# Patient Record
Sex: Female | Born: 1985
Health system: Southern US, Community
[De-identification: ages and names within clinical notes are randomized; demographics above are authoritative.]

## PROBLEM LIST (undated history)

## (undated) DIAGNOSIS — Z789 Other specified health status: Secondary | ICD-10-CM

## (undated) DIAGNOSIS — R42 Dizziness and giddiness: Secondary | ICD-10-CM

---
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
## 2003-02-03 ENCOUNTER — Inpatient Hospital Stay (HOSPITAL_COMMUNITY): Admission: AD | Admit: 2003-02-03 | Discharge: 2003-02-03 | Payer: Self-pay | Admitting: Obstetrics and Gynecology

## 2003-03-15 ENCOUNTER — Inpatient Hospital Stay (HOSPITAL_COMMUNITY): Admission: AD | Admit: 2003-03-15 | Discharge: 2003-03-15 | Payer: Self-pay | Admitting: Obstetrics & Gynecology

## 2006-03-28 ENCOUNTER — Emergency Department (HOSPITAL_COMMUNITY): Admission: EM | Admit: 2006-03-28 | Discharge: 2006-03-28 | Payer: Self-pay | Admitting: Family Medicine

## 2006-12-06 ENCOUNTER — Encounter (INDEPENDENT_AMBULATORY_CARE_PROVIDER_SITE_OTHER): Payer: Self-pay | Admitting: *Deleted

## 2008-04-06 ENCOUNTER — Inpatient Hospital Stay (HOSPITAL_COMMUNITY): Admission: AD | Admit: 2008-04-06 | Discharge: 2008-04-06 | Payer: Self-pay | Admitting: Obstetrics & Gynecology

## 2008-05-12 ENCOUNTER — Ambulatory Visit (HOSPITAL_COMMUNITY): Admission: RE | Admit: 2008-05-12 | Discharge: 2008-05-12 | Payer: Self-pay | Admitting: Obstetrics & Gynecology

## 2008-05-18 ENCOUNTER — Ambulatory Visit (HOSPITAL_COMMUNITY): Admission: RE | Admit: 2008-05-18 | Discharge: 2008-05-18 | Payer: Self-pay | Admitting: Family Medicine

## 2008-06-17 ENCOUNTER — Ambulatory Visit (HOSPITAL_COMMUNITY): Admission: RE | Admit: 2008-06-17 | Discharge: 2008-06-17 | Payer: Self-pay | Admitting: Obstetrics & Gynecology

## 2008-08-21 ENCOUNTER — Inpatient Hospital Stay (HOSPITAL_COMMUNITY): Admission: AD | Admit: 2008-08-21 | Discharge: 2008-08-22 | Payer: Self-pay | Admitting: Family Medicine

## 2008-08-21 ENCOUNTER — Ambulatory Visit: Payer: Self-pay | Admitting: Obstetrics and Gynecology

## 2008-11-17 ENCOUNTER — Ambulatory Visit: Payer: Self-pay | Admitting: Obstetrics and Gynecology

## 2008-11-17 ENCOUNTER — Inpatient Hospital Stay (HOSPITAL_COMMUNITY): Admission: AD | Admit: 2008-11-17 | Discharge: 2008-11-17 | Payer: Self-pay | Admitting: Obstetrics & Gynecology

## 2008-11-19 ENCOUNTER — Inpatient Hospital Stay (HOSPITAL_COMMUNITY): Admission: AD | Admit: 2008-11-19 | Discharge: 2008-11-23 | Payer: Self-pay | Admitting: Obstetrics & Gynecology

## 2008-11-19 ENCOUNTER — Ambulatory Visit: Payer: Self-pay | Admitting: Obstetrics & Gynecology

## 2008-11-20 ENCOUNTER — Encounter: Payer: Self-pay | Admitting: Obstetrics & Gynecology

## 2008-11-20 ENCOUNTER — Ambulatory Visit: Payer: Self-pay | Admitting: Obstetrics & Gynecology

## 2008-12-29 ENCOUNTER — Inpatient Hospital Stay (HOSPITAL_COMMUNITY): Admission: AD | Admit: 2008-12-29 | Discharge: 2008-12-29 | Payer: Self-pay | Admitting: Obstetrics & Gynecology

## 2010-01-07 ENCOUNTER — Emergency Department (HOSPITAL_BASED_OUTPATIENT_CLINIC_OR_DEPARTMENT_OTHER): Admission: EM | Admit: 2010-01-07 | Discharge: 2010-01-07 | Payer: Self-pay | Admitting: Emergency Medicine

## 2010-01-07 ENCOUNTER — Ambulatory Visit: Payer: Self-pay | Admitting: Radiology

## 2010-12-27 LAB — DIFFERENTIAL
Basophils Absolute: 0.1 10*3/uL (ref 0.0–0.1)
Eosinophils Absolute: 0 10*3/uL (ref 0.0–0.7)
Lymphs Abs: 0.8 10*3/uL (ref 0.7–4.0)
Neutrophils Relative %: 80 % — ABNORMAL HIGH (ref 43–77)

## 2010-12-27 LAB — CBC
HCT: 37.8 % (ref 36.0–46.0)
Hemoglobin: 12.2 g/dL (ref 12.0–15.0)
MCHC: 32.3 g/dL (ref 30.0–36.0)
MCV: 84.6 fL (ref 78.0–100.0)
Platelets: 234 10*3/uL (ref 150–400)
RBC: 4.47 MIL/uL (ref 3.87–5.11)
RDW: 13.4 % (ref 11.5–15.5)
WBC: 10.3 10*3/uL (ref 4.0–10.5)

## 2010-12-27 LAB — URINE CULTURE

## 2010-12-27 LAB — URINE MICROSCOPIC-ADD ON

## 2010-12-27 LAB — PREGNANCY, URINE: Preg Test, Ur: NEGATIVE

## 2010-12-27 LAB — URINALYSIS, ROUTINE W REFLEX MICROSCOPIC: pH: 6 (ref 5.0–8.0)

## 2011-01-03 ENCOUNTER — Inpatient Hospital Stay (HOSPITAL_COMMUNITY)
Admission: AD | Admit: 2011-01-03 | Discharge: 2011-01-03 | Disposition: A | Discharge status: Home / Self Care | Payer: Medicaid Other | Source: Ambulatory Visit | Attending: Obstetrics and Gynecology | Admitting: Obstetrics and Gynecology

## 2011-01-03 DIAGNOSIS — A499 Bacterial infection, unspecified: Secondary | ICD-10-CM

## 2011-01-03 DIAGNOSIS — N949 Unspecified condition associated with female genital organs and menstrual cycle: Secondary | ICD-10-CM | POA: Insufficient documentation

## 2011-01-03 DIAGNOSIS — N760 Acute vaginitis: Secondary | ICD-10-CM | POA: Insufficient documentation

## 2011-01-03 DIAGNOSIS — B373 Candidiasis of vulva and vagina: Secondary | ICD-10-CM

## 2011-01-03 DIAGNOSIS — B3731 Acute candidiasis of vulva and vagina: Secondary | ICD-10-CM | POA: Insufficient documentation

## 2011-01-03 DIAGNOSIS — B9689 Other specified bacterial agents as the cause of diseases classified elsewhere: Secondary | ICD-10-CM | POA: Insufficient documentation

## 2011-01-03 LAB — WET PREP, GENITAL: Yeast Wet Prep HPF POC: NONE SEEN

## 2011-01-18 LAB — DIFFERENTIAL
Basophils Relative: 2 % — ABNORMAL HIGH (ref 0–1)
Eosinophils Absolute: 0.1 10*3/uL (ref 0.0–0.7)
Monocytes Relative: 7 % (ref 3–12)

## 2011-01-18 LAB — CBC
MCHC: 32.6 g/dL (ref 30.0–36.0)
Platelets: 248 10*3/uL (ref 150–400)
WBC: 4 10*3/uL (ref 4.0–10.5)

## 2011-01-18 LAB — GC/CHLAMYDIA PROBE AMP, GENITAL
Chlamydia, DNA Probe: NEGATIVE
GC Probe Amp, Genital: NEGATIVE

## 2011-01-18 LAB — WET PREP, GENITAL
Clue Cells Wet Prep HPF POC: NONE SEEN
Yeast Wet Prep HPF POC: NONE SEEN

## 2011-01-23 LAB — COMPREHENSIVE METABOLIC PANEL
ALT: 17 U/L (ref 0–35)
Calcium: 9.4 mg/dL (ref 8.4–10.5)
GFR calc Af Amer: >60 mL/min (ref >60)
Glucose, Bld: 100 mg/dL — ABNORMAL HIGH (ref 70–99)
Sodium: 134 mEq/L — ABNORMAL LOW (ref 135–145)
Total Protein: 6.5 g/dL (ref 6.0–8.3)

## 2011-01-23 LAB — CBC
HCT: 26.6 % — ABNORMAL LOW (ref 36.0–46.0)
MCV: 90.3 fL (ref 78.0–100.0)
Platelets: 173 10*3/uL (ref 150–400)
Platelets: 195 10*3/uL (ref 150–400)
RDW: 15.1 % (ref 11.5–15.5)
WBC: 14.4 10*3/uL — ABNORMAL HIGH (ref 4.0–10.5)

## 2011-01-23 LAB — RPR: RPR Ser Ql: NONREACTIVE

## 2011-02-20 NOTE — Discharge Summary (Signed)
NAMETARALYN, FERRAIOLO                 ACCOUNT NO.:  [id number]   MEDICAL RECORD NO.:  [id number]          PATIENT TYPE:  INP   LOCATION:  9131                          FACILITY:  [facility]   PHYSICIAN:  Scheryl Darter, MD       DATE OF BIRTH:  18-Nov-1985   DATE OF ADMISSION:  11/19/2008  DATE OF DISCHARGE:  11/23/2008                               DISCHARGE SUMMARY   DISCHARGE DIAGNOSES:  1. Primary low-transverse cesarean section.  2. History of chorioamnionitis.  3. Anemia.   DISCHARGE MEDICATIONS:  1. Percocet 5/325 mg 1 tablet p.o. q.6 h. p.r.n. pain.  2. Micronor OCP 1 tablet p.o. daily at the same time everyday.  3. Prenatal vitamin 1 tablet p.o. daily.  4. Iron sulfate 325 mg 1 tablet p.o. b.i.d. for postpartum anemia.   LABORATORY DATA:  1. CBC on November 19, 2008:  WBC 7.6, hemoglobin 12.0, hematocrit      36.5, platelets 195.  2. CMP:  Sodium 134, AST 34, ALT 17, creatinine 0.77.  3. RPR nonreactive.  4. CBC on November 21, 2008:  WBC 14.4, hemoglobin 9.0, hematocrit      26.6, platelet count 173.   BRIEF HOSPITAL COURSE:  The patient is a 25 year old G1, P1 admitted for  spontaneous onset of labor who had chorioamnionitis as well as a primary  low-transverse cesarean section.  1. Primary low-transverse cesarean section.  The patient was admitted      on November 19, 2008, in labor.  The patient was having adequate      contractions and making cervical change.  Fetal tachycardia was      noted with variables.  The patient remained in second stage arrest      for 5 hours.  Therefore, the patient was consented for a C-section.      The procedure was a primary low-transverse cesarean section.  The      attending was Dr. Debroah Loop.  Estimated fetal weight was 6 pounds 15      ounces.  Apgars were 8 and 9.  Dr. Debroah Loop delivered a viable live-      born female at 0600 on November 20, 2008, under epidural      anesthesia.  Estimated blood loss was 700 mL.  The patient      tolerated  the procedure well with no immediate complications.      After the surgery, the patient recovered well.  On the day of      discharge, the patient's abdominal pain had significantly improved,      but was still present, especially with pumping, but this was      controlled with ibuprofen and Percocet.  The patient was tolerating      p.o.'s, ambulating, positive void, and had decreased bleeding.  The      patient's incision was clean, dry, and intact and her fundus was      firm.  The patient was in complete understanding and agreement with      discharge.  2. Chorioamnionitis.  During the second stage of labor, there was  noted to have fetal tachycardia as well as some occasional      variables.  The patient did not end up spiking a recorded fever,      however, antibiotics were started for chorioamnionitis.  The      patient was taken to C-section for definitive treatment.  3. Postpartum anemia:  The patient had a hemoglobin drop from 12-9.      This was after surgery on postop day #2.  The patient's bleeding      had significantly decreased after the surgery and was only faint at      the time of discharge.  The patient denied any lightheadedness,      dizziness, or other complaints.  The patient will be sent home with      iron sulfate.   DISCHARGE INSTRUCTIONS:  The patient is to have pelvic rest x6 weeks.  The patient should increase her activity slowly.  The patient has no  restrictions on her diet.   FOLLOWUP:  The patient should follow up with [facility]  in 6 weeks for routine postpartum care.   DISCHARGE CONDITION:  Good.      Angelena Sole, MD      Scheryl Darter, MD  Electronically Signed    WS/MEDQ  D:  11/23/2008  T:  11/23/2008  Job:  [id number]

## 2011-02-20 NOTE — Op Note (Signed)
NAMESTEFANNIE, [name]                 ACCOUNT NO.:  [id number]   MEDICAL RECORD NO.:  [id number]          PATIENT TYPE:  INP   LOCATION:  9131                          FACILITY:  [facility]   PHYSICIAN:  Scheryl Darter, MD       DATE OF BIRTH:  1986/02/04   DATE OF PROCEDURE:  DATE OF DISCHARGE:                               OPERATIVE REPORT   PROCEDURE:  Primary low transverse cesarean section.   PREOPERATIVE DIAGNOSES:  1. Intrauterine pregnancy at term,  second stage arrest of labor.  2. Intrauterine infection.   POSTOPERATIVE DIAGNOSES:  Intrauterine pregnancy, delivered a live born  female at 16 with Apgars of 8 and 9, weighed 6 pounds and 15 ounces.   SURGEON:  Scheryl Darter, MD   ASSISTANT:  Malachi Pro. Ambrose Mantle, MD   ANESTHESIA:  Epidural.   ESTIMATED BLOOD LOSS:  700 mL.   SPECIMENS:  Placenta was sent to pathology.   DRAIN:  Foley catheter.   FLUIDS:  IV fluids 1500 mL.   URINE OUTPUT:  150 mL.   COUNTS:  Correct.   OPERATIVE COURSE:  The patient gave written consent for primary cesarean  section after diagnosis of second stage arrest of labor was made at 40  weeks, 3 days gestation.  The patient had rupture of membranes greater  than 20 hours and borderline elevated blood pressure and fetal  tachycardia.  Ampicillin and gentamicin had been given.  The patient  identification was confirmed.  She was brought to the OR and adequate  epidural anesthesia was induced.  She was placed in dorsal supine  position.  A Foley catheter was placed.  Abdomen was sterilely prepped  and draped.  A #10 blade was used to make a Pfannenstiel incision down  to the fascia.  The fascia was incised and the incision was extended  transversely with curved Mayo scissors.  The fascia was separated from  underlying tissue attachments with blunt and sharp dissection.  The  rectus muscles were separated at the midline.  The peritoneum was  elevated and incised with Metzenbaum scissors and incision was  extended  vertically.  Bladder blade was inserted.  Vesicouterine fold was incised  with Metzenbaum scissors and the incision was extended transversely and  bladder flap was created.  Bladder blade was reinserted.  The lower  uterine segment was entered at the midline with #10 blade and meconium  stained fluid was expressed.  Incision was extended transversely with  blunt traction.  The fetal head was elevated from the pelvis and  delivered.  Mouth and nose were cleared with bulb suction and with DeLee  suction.  The infant was delivered and the infant was vigorous at birth.  Cord was clamped and cut, and the infant was handed to nursery personnel  in attendance.  The infant was a female, 6 pounds 15 ounces, Apgars 8  and 9, delivered at 0601.  Placenta was removed and sent, and the  uterine cavity was explored.  A 0 Vicryl was used to close the uterine  incision with a running-locking suture.  An imbricating  layer followed  with 0 Vicryl and hemostasis was obtained at the right side of the  incision with an interrupted suture.  Good hemostasis was assured and  the pelvis was irrigated.  Both tubes and ovaries were visualized and  were normal.  The anterior peritoneum was closed with a running suture  of 2-0 Vicryl.  Fascia was closed with running suture of 0-Vicryl.  Hemostasis was assured and the incision was irrigated.  The skin was  closed with a running subcuticular suture with 4-0 Vicryl.  Sterile  dressing was applied.  The patient tolerated the procedure well without  complications.  The placenta was sent to pathology.  She was brought in  stable condition to PACU.      Scheryl Darter, MD     JA/MEDQ  D:  11/20/2008  T:  11/20/2008  Job:  [phone] [id number]

## 2011-07-05 LAB — URINALYSIS, ROUTINE W REFLEX MICROSCOPIC
Bilirubin Urine: NEGATIVE
Glucose, UA: NEGATIVE
Ketones, ur: NEGATIVE
Nitrite: NEGATIVE
pH: 5.5

## 2011-07-10 LAB — URINALYSIS, ROUTINE W REFLEX MICROSCOPIC
Glucose, UA: NEGATIVE
Specific Gravity, Urine: <1.005 — ABNORMAL LOW
pH: 6

## 2011-07-10 LAB — WET PREP, GENITAL
Clue Cells Wet Prep HPF POC: NONE SEEN
Trich, Wet Prep: NONE SEEN
Yeast Wet Prep HPF POC: NONE SEEN

## 2011-09-10 ENCOUNTER — Inpatient Hospital Stay (HOSPITAL_COMMUNITY)
Admission: AD | Admit: 2011-09-10 | Discharge: 2011-09-10 | Disposition: A | Discharge status: Home / Self Care | Payer: Self-pay | Source: Ambulatory Visit | Attending: Obstetrics & Gynecology | Admitting: Obstetrics & Gynecology

## 2011-09-10 ENCOUNTER — Encounter (HOSPITAL_COMMUNITY): Payer: Self-pay | Admitting: *Deleted

## 2011-09-10 DIAGNOSIS — K529 Noninfective gastroenteritis and colitis, unspecified: Secondary | ICD-10-CM

## 2011-09-10 DIAGNOSIS — K5289 Other specified noninfective gastroenteritis and colitis: Secondary | ICD-10-CM | POA: Insufficient documentation

## 2011-09-10 DIAGNOSIS — R109 Unspecified abdominal pain: Secondary | ICD-10-CM | POA: Insufficient documentation

## 2011-09-10 HISTORY — DX: Other specified health status: Z78.9

## 2011-09-10 LAB — URINALYSIS, ROUTINE W REFLEX MICROSCOPIC
Bilirubin Urine: NEGATIVE
Ketones, ur: 15 mg/dL — AB
Nitrite: NEGATIVE
Urobilinogen, UA: 0.2 mg/dL (ref 0.0–1.0)

## 2011-09-10 LAB — DIFFERENTIAL
Basophils Relative: 0 % (ref 0–1)
Eosinophils Absolute: 0 10*3/uL (ref 0.0–0.7)
Eosinophils Relative: 0 % (ref 0–5)
Monocytes Relative: 9 % (ref 3–12)
Neutrophils Relative %: 79 % — ABNORMAL HIGH (ref 43–77)

## 2011-09-10 LAB — COMPREHENSIVE METABOLIC PANEL
Albumin: 3.7 g/dL (ref 3.5–5.2)
Alkaline Phosphatase: 116 U/L (ref 39–117)
BUN: 12 mg/dL (ref 6–23)
Calcium: 9.8 mg/dL (ref 8.4–10.5)
Potassium: 3.4 mEq/L — ABNORMAL LOW (ref 3.5–5.1)
Sodium: 135 mEq/L (ref 135–145)
Total Protein: 8.2 g/dL (ref 6.0–8.3)

## 2011-09-10 LAB — CBC
MCH: 27.8 pg (ref 26.0–34.0)
MCHC: 33.2 g/dL (ref 30.0–36.0)
Platelets: 288 10*3/uL (ref 150–400)

## 2011-09-10 LAB — URINE MICROSCOPIC-ADD ON

## 2011-09-10 LAB — POCT PREGNANCY, URINE: Preg Test, Ur: NEGATIVE

## 2011-09-10 MED ORDER — IBUPROFEN 400 MG PO TABS
400.0000 mg | ORAL_TABLET | Freq: Once | ORAL | Status: AC
  Administered 2011-09-10: 400 mg via ORAL
  Filled 2011-09-10: qty 1

## 2011-09-10 MED ORDER — ONDANSETRON 8 MG PO TBDP
8.0000 mg | ORAL_TABLET | Freq: Once | ORAL | Status: AC
  Administered 2011-09-10: 8 mg via ORAL
  Filled 2011-09-10: qty 1

## 2011-09-10 MED ORDER — PROMETHAZINE HCL 25 MG PO TABS: 12.5000 mg | ORAL_TABLET | Freq: Four times a day (QID) | ORAL | Status: AC | PRN

## 2011-09-10 NOTE — ED Provider Notes (Signed)
Attestation of Attending Supervision of Advanced Practitioner: Evaluation and management procedures were performed by the PA/NP/CNM/OB Fellow under my supervision/collaboration. Chart reviewed, and agree with management and plan.  Dionte Blaustein, M.D. 09/10/2011 2:55 PM   

## 2011-09-10 NOTE — Progress Notes (Signed)
Pt in c/o abdominal cramping yesterday, has since subsided but now has epigastric pain, tender to touch.  LMP 09/07/11.  Reports lightheadedness and dizziness today.

## 2011-09-10 NOTE — ED Provider Notes (Signed)
History     CSN: [id number] Arrival date & time: 09/10/2011 11:34 AM   None     Chief Complaint  Patient presents with  . Abdominal Pain    HPI [name] is a 25 y.o. female who presents to MAU for nausea, diarrhea and abdominal pain that started yesterday. No birth control. LMP 12//01/12. Last pap smear less than one year ago and was normal. Current sex partner x 2 years. No history of STDs.   Past Medical History  Diagnosis Date  . No pertinent past medical history     Past Surgical History  Procedure Date  . Cesarean section     History reviewed. No pertinent family history.  History  Substance Use Topics  . Smoking status: Never Smoker   . Smokeless tobacco: Not on file  . Alcohol Use: No    OB History    Grav Para Term Preterm Abortions TAB SAB Ect Mult Living   1 1 1  0 0 0 0 0 0 1      Review of Systems  Constitutional: Positive for appetite change and fatigue. Negative for fever, chills and diaphoresis.  HENT: Negative for ear pain, congestion, sore throat, facial swelling, neck pain, neck stiffness, dental problem and sinus pressure.   Eyes: Negative for photophobia, pain and discharge.  Respiratory: Negative for cough, chest tightness and wheezing.   Cardiovascular: Negative.   Gastrointestinal: Positive for nausea, abdominal pain and diarrhea. Negative for vomiting, constipation and abdominal distention.  Genitourinary: Positive for vaginal bleeding. Negative for dysuria, urgency, frequency, flank pain, vaginal discharge and difficulty urinating.  Musculoskeletal: Positive for back pain. Negative for myalgias and gait problem.  Skin: Negative for color change and rash.  Neurological: Positive for dizziness, light-headedness and headaches. Negative for speech difficulty, weakness and numbness.  Psychiatric/Behavioral: Negative for confusion and agitation.    Allergies  Review of patient's allergies indicates no known allergies.  Home Medications    No current outpatient prescriptions on file.  BP 113/77  Pulse 75  Temp(Src) 99.1 F (37.3 C) (Oral)  Resp 16  Ht 5\' 4"  (1.626 m)  Wt 159 lb 4 oz (72.235 kg)  BMI 27.34 kg/m2  LMP 09/07/2011  Physical Exam  Nursing note and vitals reviewed. Constitutional: She is oriented to person, place, and time. She appears well-developed and well-nourished.  HENT:  Head: Normocephalic.       No nystagmus.   Eyes: EOM are normal.  Neck: Neck supple.  Cardiovascular: Normal rate and regular rhythm.   Pulmonary/Chest: Effort normal and breath sounds normal.  Abdominal: Soft. There is tenderness in the epigastric area.  Musculoskeletal: Normal range of motion. She exhibits no edema.  Neurological: She is alert and oriented to person, place, and time. She has normal strength. No cranial nerve deficit or sensory deficit. She displays a negative Romberg sign.  Reflex Scores:      Bicep reflexes are 2+ on the right side and 2+ on the left side.      Brachioradialis reflexes are 2+ on the right side and 2+ on the left side.      Patellar reflexes are 1+ on the right side and 1+ on the left side.      Normal gait. Stands on one foot without difficulty. Rapid alternating movements without difficulty.  Skin: Skin is warm and dry.  Psychiatric: She has a normal mood and affect. Her behavior is normal. Judgment and thought content normal.   Results for orders placed during  the hospital encounter of 09/10/11 (from the past 24 hour(s))  URINALYSIS, ROUTINE W REFLEX MICROSCOPIC     Status: Abnormal   Collection Time   09/10/11 12:02 PM      Component Value Range   Color, Urine YELLOW  YELLOW    APPearance CLEAR  CLEAR    Specific Gravity, Urine 1.020  1.005 - 1.030    pH 5.5  5.0 - 8.0    Glucose, UA NEGATIVE  NEGATIVE (mg/dL)   Hgb urine dipstick TRACE (*) NEGATIVE    Bilirubin Urine NEGATIVE  NEGATIVE    Ketones, ur 15 (*) NEGATIVE (mg/dL)   Protein, ur NEGATIVE  NEGATIVE (mg/dL)   Urobilinogen,  UA 0.2  0.0 - 1.0 (mg/dL)   Nitrite NEGATIVE  NEGATIVE    Leukocytes, UA NEGATIVE  NEGATIVE   URINE MICROSCOPIC-ADD ON     Status: Normal   Collection Time   09/10/11 12:02 PM      Component Value Range   Squamous Epithelial / LPF RARE  RARE    WBC, UA 0-2  <3 (WBC/hpf)   Bacteria, UA RARE  RARE   CBC     Status: Normal   Collection Time   09/10/11 12:33 PM      Component Value Range   WBC 6.7  4.0 - 10.5 (K/uL)   RBC 4.46  3.87 - 5.11 (MIL/uL)   Hemoglobin 12.4  12.0 - 15.0 (g/dL)   HCT 78.2  95.6 - 21.3 (%)   MCV 83.9  78.0 - 100.0 (fL)   MCH 27.8  26.0 - 34.0 (pg)   MCHC 33.2  30.0 - 36.0 (g/dL)   RDW 08.6  57.8 - 46.9 (%)   Platelets 288  150 - 400 (K/uL)  DIFFERENTIAL     Status: Abnormal   Collection Time   09/10/11 12:33 PM      Component Value Range   Neutrophils Relative 79 (*) 43 - 77 (%)   Neutro Abs 5.3  1.7 - 7.7 (K/uL)   Lymphocytes Relative 12  12 - 46 (%)   Lymphs Abs 0.8  0.7 - 4.0 (K/uL)   Monocytes Relative 9  3 - 12 (%)   Monocytes Absolute 0.6  0.1 - 1.0 (K/uL)   Eosinophils Relative 0  0 - 5 (%)   Eosinophils Absolute 0.0  0.0 - 0.7 (K/uL)   Basophils Relative 0  0 - 1 (%)   Basophils Absolute 0.0  0.0 - 0.1 (K/uL)  COMPREHENSIVE METABOLIC PANEL     Status: Abnormal   Collection Time   09/10/11 12:33 PM      Component Value Range   Sodium 135  135 - 145 (mEq/L)   Potassium 3.4 (*) 3.5 - 5.1 (mEq/L)   Chloride 100  96 - 112 (mEq/L)   CO2 23  19 - 32 (mEq/L)   Glucose, Bld 101 (*) 70 - 99 (mg/dL)   BUN 12  6 - 23 (mg/dL)   Creatinine, Ser 6.29  0.50 - 1.10 (mg/dL)   Calcium 9.8  8.4 - 52.8 (mg/dL)   Total Protein 8.2  6.0 - 8.3 (g/dL)   Albumin 3.7  3.5 - 5.2 (g/dL)   AST 59 (*) 0 - 37 (U/L)   ALT 39 (*) 0 - 35 (U/L)   Alkaline Phosphatase 116  39 - 117 (U/L)   Total Bilirubin 0.4  0.3 - 1.2 (mg/dL)   GFR calc non Af Amer >90  >90 (mL/min)   GFR calc Af Amer >  90  >90 (mL/min)  POCT PREGNANCY, URINE     Status: Normal   Collection Time    09/10/11 12:47 PM      Component Value Range   Preg Test, Ur NEGATIVE     Assessment:  Gastroenteritis  Plan:  B.R.A.T. Diet   Phenergan 12.5 mg    Return as needed.  ED Course  Procedures   MDM          Kerrie Buffalo, NP 09/10/11 1431

## 2011-09-10 NOTE — Progress Notes (Signed)
Pt states epigastric pain, feels like cramping, has been nauseated. LMP-09/07/2011, denies abnormal vaginal d/c changes. Stood up at work today, felt dizzy and had to sit down as not to fall, same scenario yesterday. Has been nauseated, had diarrhea. Denies cough/congestion/sore throat.

## 2012-11-26 ENCOUNTER — Encounter (HOSPITAL_COMMUNITY): Payer: Self-pay | Admitting: *Deleted

## 2012-11-26 ENCOUNTER — Emergency Department (HOSPITAL_COMMUNITY)
Admission: EM | Admit: 2012-11-26 | Discharge: 2012-11-26 | Disposition: A | Discharge status: Home / Self Care | Payer: Self-pay | Attending: Emergency Medicine | Admitting: Emergency Medicine

## 2012-11-26 DIAGNOSIS — J329 Chronic sinusitis, unspecified: Secondary | ICD-10-CM | POA: Insufficient documentation

## 2012-11-26 MED ORDER — FLUTICASONE PROPIONATE 50 MCG/ACT NA SUSP: 2.0000 | Freq: Every day | NASAL | Status: DC

## 2012-11-26 MED ORDER — HYDROCODONE-ACETAMINOPHEN 5-325 MG PO TABS: 1.0000 | ORAL_TABLET | ORAL | Status: DC | PRN

## 2012-11-26 MED ORDER — AMOXICILLIN 500 MG PO CAPS: 500.0000 mg | ORAL_CAPSULE | Freq: Three times a day (TID) | ORAL | Status: DC

## 2012-11-26 NOTE — ED Provider Notes (Signed)
History     CSN: [id number]  Arrival date & time 11/26/12  1638   First MD Initiated Contact with Patient 11/26/12 1722      Chief Complaint  Patient presents with  . Dizziness  . Nasal Congestion    (Consider location/radiation/quality/duration/timing/severity/associated sxs/prior treatment) HPI Comments: [name] is a 27 y.o. Female who is here for evaluation of nasal congestion, nasal discharge, and facial pain, for 1 week. The pain is pressure-like. There is no radiation. She has had symptoms similar to this previously. She denies fever, chills, nausea, vomiting, ear pain, shortness of breath, chest pain, weakness, or persistent dizziness. There are no known modifying factors.  The history is provided by the patient.    Past Medical History  Diagnosis Date  . No pertinent past medical history     Past Surgical History  Procedure Laterality Date  . Cesarean section      History reviewed. No pertinent family history.  History  Substance Use Topics  . Smoking status: Never Smoker   . Smokeless tobacco: Not on file  . Alcohol Use: No    OB History   Grav Para Term Preterm Abortions TAB SAB Ect Mult Living   1 1 1  0 0 0 0 0 0 1      Review of Systems  All other systems reviewed and are negative.    Allergies  Review of patient's allergies indicates no known allergies.  Home Medications   Current Outpatient Rx  Name  Route  Sig  Dispense  Refill  . Diphenhydramine-PE-APAP (THERAFLU WARMING RELIEF NIGHT) 12.5-5-325 MG/15ML LIQD   Oral   Take 15 mLs by mouth at bedtime as needed (for cold/flu symptoms).         Marland Kitchen ibuprofen (ADVIL,MOTRIN) 200 MG tablet   Oral   Take 400 mg by mouth every 6 (six) hours as needed for pain.         Marland Kitchen Phenylephrine-APAP-Guaifenesin (TYLENOL SINUS CONGESTION/PAIN) 5-325-200 MG TABS   Oral   Take 2 tablets by mouth every 4 (four) hours as needed (for sinus/congestion pain).         Marland Kitchen amoxicillin (AMOXIL) 500 MG  capsule   Oral   Take 1 capsule (500 mg total) by mouth 3 (three) times daily.   21 capsule   0   . fluticasone (FLONASE) 50 MCG/ACT nasal spray   Nasal   Place 2 sprays into the nose daily.   16 g   0   . HYDROcodone-acetaminophen (NORCO) 5-325 MG per tablet   Oral   Take 1 tablet by mouth every 4 (four) hours as needed for pain.   20 tablet   0     BP 121/81  Pulse 73  Temp(Src) 98.5 F (36.9 C) (Oral)  Resp 18  SpO2 95%  LMP 11/12/2012  Physical Exam  Nursing note and vitals reviewed. Constitutional: She is oriented to person, place, and time. She appears well-developed and well-nourished.  HENT:  Head: Normocephalic and atraumatic.  Right Ear: External ear normal.  Left Ear: External ear normal.  Mild face pain, to percussion over the frontal and maxillary sinuses, bilaterally. No overt nasal discharge  Eyes: Conjunctivae and EOM are normal. Pupils are equal, round, and reactive to light. Right eye exhibits no discharge. Left eye exhibits no discharge.  Neck: Normal range of motion and phonation normal. Neck supple.  Cardiovascular: Normal rate, regular rhythm and intact distal pulses.   Pulmonary/Chest: Effort normal and breath sounds normal.  She exhibits no tenderness.  Abdominal: Soft. She exhibits no distension. There is no tenderness. There is no guarding.  Musculoskeletal: Normal range of motion.  Neurological: She is alert and oriented to person, place, and time. She has normal strength. She exhibits normal muscle tone.  Skin: Skin is warm and dry.  Psychiatric: Her behavior is normal. Judgment and thought content normal.  Anxious    ED Course  Procedures (including critical care time)   Nursing notes, applicable records and vitals reviewed.  Radiologic Images/Reports reviewed.    1. Sinusitis       MDM  Evaluation consistent with sinusitis. Doubt metabolic instability, serious bacterial infection or impending vascular collapse; the patient  is stable for discharge.       Plan: Home Medications- Norco, amoxicillin, Flonase, Afrin; Home Treatments- rest, fluids, work release, ; Recommended follow up- PCP of choice when necessary   Flint Melter, MD 11/26/12 1746

## 2012-11-26 NOTE — ED Notes (Signed)
Reports having nasal congestion and dizziness that started on thurs but progressively became more severe. Pt is ambulatory at triage, no neuro deficits noted.

## 2013-10-28 ENCOUNTER — Encounter (HOSPITAL_COMMUNITY): Payer: Self-pay | Admitting: *Deleted

## 2013-10-28 ENCOUNTER — Inpatient Hospital Stay (HOSPITAL_COMMUNITY)
Admission: AD | Admit: 2013-10-28 | Discharge: 2013-10-28 | Disposition: A | Discharge status: Home / Self Care | Payer: Medicaid Other | Source: Ambulatory Visit | Attending: Obstetrics and Gynecology | Admitting: Obstetrics and Gynecology

## 2013-10-28 DIAGNOSIS — N390 Urinary tract infection, site not specified: Secondary | ICD-10-CM | POA: Insufficient documentation

## 2013-10-28 DIAGNOSIS — R350 Frequency of micturition: Secondary | ICD-10-CM | POA: Insufficient documentation

## 2013-10-28 DIAGNOSIS — N949 Unspecified condition associated with female genital organs and menstrual cycle: Secondary | ICD-10-CM | POA: Insufficient documentation

## 2013-10-28 LAB — URINALYSIS, ROUTINE W REFLEX MICROSCOPIC
Bilirubin Urine: NEGATIVE
GLUCOSE, UA: NEGATIVE mg/dL
Ketones, ur: NEGATIVE mg/dL
Leukocytes, UA: NEGATIVE
Nitrite: POSITIVE — AB
Protein, ur: NEGATIVE mg/dL
SPECIFIC GRAVITY, URINE: 1.025 (ref 1.005–1.030)
Urobilinogen, UA: 0.2 mg/dL (ref 0.0–1.0)
pH: 5.5 (ref 5.0–8.0)

## 2013-10-28 LAB — URINE MICROSCOPIC-ADD ON

## 2013-10-28 LAB — POCT PREGNANCY, URINE: PREG TEST UR: NEGATIVE

## 2013-10-28 MED ORDER — CIPROFLOXACIN HCL 250 MG PO TABS: 250.0000 mg | ORAL_TABLET | Freq: Two times a day (BID) | ORAL | Status: DC

## 2013-10-28 MED ORDER — PHENAZOPYRIDINE HCL 200 MG PO TABS: 200.0000 mg | ORAL_TABLET | Freq: Three times a day (TID) | ORAL | Status: DC

## 2013-10-28 NOTE — MAU Provider Note (Signed)
History     CSN: [id number]  Arrival date and time: 10/28/13 1528   First Provider Initiated Contact with Patient 10/28/13 1606      Chief Complaint  Patient presents with  . Dysuria   HPI [name] is a 28 y.o. G1P1001 who presents to MAU today with complaint of painful urination since Friday. She states that she started taking AZO tabs with some relief. She is also having frequency and urgency of urination. The patient endorses mild pelvic pain with full bladder. She denies flank pain or fever.   OB History   Grav Para Term Preterm Abortions TAB SAB Ect Mult Living   1 1 1  0 0 0 0 0 0 1      Past Medical History  Diagnosis Date  . No pertinent past medical history     Past Surgical History  Procedure Laterality Date  . Cesarean section      History reviewed. No pertinent family history.  History  Substance Use Topics  . Smoking status: Never Smoker   . Smokeless tobacco: Not on file  . Alcohol Use: No    Allergies: No Known Allergies  No prescriptions prior to admission    Review of Systems  Constitutional: Negative for fever and malaise/fatigue.  Gastrointestinal: Positive for abdominal pain.  Genitourinary: Positive for dysuria, urgency and frequency. Negative for hematuria and flank pain.       Neg - vaginal bleeding, discharge   Physical Exam   Blood pressure 116/76, pulse 72, temperature 98.3 F (36.8 C), temperature source Oral, resp. rate 18, height 5\' 3"  (1.6 m), weight 175 lb 9.6 oz (79.652 kg), last menstrual period 10/20/2013.  Physical Exam  Constitutional: She is oriented to person, place, and time. She appears well-developed and well-nourished. No distress.  HENT:  Head: Normocephalic and atraumatic.  Cardiovascular: Normal rate.   Respiratory: Effort normal.  GI: Soft. She exhibits no distension and no mass. There is no tenderness. There is no rebound, no guarding and no CVA tenderness.  Neurological: She is alert and oriented  to person, place, and time.  Skin: Skin is warm and dry. No erythema.  Psychiatric: She has a normal mood and affect.   Results for orders placed during the hospital encounter of 10/28/13 (from the past 24 hour(s))  URINALYSIS, ROUTINE W REFLEX MICROSCOPIC     Status: Abnormal   Collection Time    10/28/13  3:55 PM      Result Value Range   Color, Urine YELLOW  YELLOW   APPearance CLEAR  CLEAR   Specific Gravity, Urine 1.025  1.005 - 1.030   pH 5.5  5.0 - 8.0   Glucose, UA NEGATIVE  NEGATIVE mg/dL   Hgb urine dipstick SMALL (*) NEGATIVE   Bilirubin Urine NEGATIVE  NEGATIVE   Ketones, ur NEGATIVE  NEGATIVE mg/dL   Protein, ur NEGATIVE  NEGATIVE mg/dL   Urobilinogen, UA 0.2  0.0 - 1.0 mg/dL   Nitrite POSITIVE (*) NEGATIVE   Leukocytes, UA NEGATIVE  NEGATIVE  URINE MICROSCOPIC-ADD ON     Status: Abnormal   Collection Time    10/28/13  3:55 PM      Result Value Range   Squamous Epithelial / LPF RARE  RARE   WBC, UA 3-6  <3 WBC/hpf   RBC / HPF 3-6  <3 RBC/hpf   Bacteria, UA MANY (*) RARE  POCT PREGNANCY, URINE     Status: None   Collection Time  10/28/13  4:19 PM      Result Value Range   Preg Test, Ur NEGATIVE  NEGATIVE     MAU Course  Procedures None  MDM UA today  Assessment and Plan  A: UTI  P: Discharge home Rx for Cipro and Pyridium sent to patient's pharmacy Patient advised to increase PO hydration as tolerated Patient may return to MAU as needed or if her condition were to change or worsen  Freddi Starr, PA-C  10/28/2013, 5:22 PM

## 2013-10-28 NOTE — Discharge Instructions (Signed)
Urinary Tract Infection °A urinary tract infection (UTI) can occur any place along the urinary tract. The tract includes the kidneys, ureters, bladder, and urethra. A type of germ called bacteria often causes a UTI. UTIs are often helped with antibiotic medicine.  °HOME CARE  °· If given, take antibiotics as told by your doctor. Finish them even if you start to feel better. °· Drink enough fluids to keep your pee (urine) clear or pale yellow. °· Avoid tea, drinks with caffeine, and bubbly (carbonated) drinks. °· Pee often. Avoid holding your pee in for a long time. °· Pee before and after having sex (intercourse). °· Wipe from front to back after you poop (bowel movement) if you are a woman. Use each tissue only once. °GET HELP RIGHT AWAY IF:  °· You have back pain. °· You have lower belly (abdominal) pain. °· You have chills. °· You feel sick to your stomach (nauseous). °· You throw up (vomit). °· Your burning or discomfort with peeing does not go away. °· You have a fever. °· Your symptoms are not better in 3 days. °MAKE SURE YOU:  °· Understand these instructions. °· Will watch your condition. °· Will get help right away if you are not doing well or get worse. °Document Released: 03/12/2008 Document Revised: 06/18/2012 Document Reviewed: 04/24/2012 °[employer]® Patient Information ©2014 [employer]. ° °

## 2013-10-28 NOTE — MAU Note (Signed)
Urinary frequency since Friday, started taking AZO, frequency is better, but is having mild dysuria now.

## 2013-10-29 NOTE — MAU Provider Note (Signed)
Attestation of Attending Supervision of Advanced Practitioner (CNM/NP): Evaluation and management procedures were performed by the Advanced Practitioner under my supervision and collaboration.  I have reviewed the Advanced Practitioner's note and chart, and I agree with the management and plan.  [name] 10/29/2013 8:38 AM   

## 2013-10-30 LAB — URINE CULTURE: Colony Count: >=100000

## 2013-12-11 ENCOUNTER — Emergency Department (HOSPITAL_COMMUNITY): Payer: No Typology Code available for payment source

## 2013-12-11 ENCOUNTER — Emergency Department (HOSPITAL_COMMUNITY)
Admission: EM | Admit: 2013-12-11 | Discharge: 2013-12-11 | Disposition: A | Discharge status: Home / Self Care | Payer: No Typology Code available for payment source | Attending: Emergency Medicine | Admitting: Emergency Medicine

## 2013-12-11 ENCOUNTER — Encounter (HOSPITAL_COMMUNITY): Payer: Self-pay | Admitting: Emergency Medicine

## 2013-12-11 DIAGNOSIS — M79602 Pain in left arm: Secondary | ICD-10-CM

## 2013-12-11 DIAGNOSIS — M542 Cervicalgia: Secondary | ICD-10-CM

## 2013-12-11 DIAGNOSIS — M25512 Pain in left shoulder: Secondary | ICD-10-CM

## 2013-12-11 DIAGNOSIS — S8990XA Unspecified injury of unspecified lower leg, initial encounter: Secondary | ICD-10-CM | POA: Insufficient documentation

## 2013-12-11 DIAGNOSIS — M25561 Pain in right knee: Secondary | ICD-10-CM

## 2013-12-11 DIAGNOSIS — S4980XA Other specified injuries of shoulder and upper arm, unspecified arm, initial encounter: Secondary | ICD-10-CM | POA: Insufficient documentation

## 2013-12-11 DIAGNOSIS — S99929A Unspecified injury of unspecified foot, initial encounter: Secondary | ICD-10-CM

## 2013-12-11 DIAGNOSIS — S46909A Unspecified injury of unspecified muscle, fascia and tendon at shoulder and upper arm level, unspecified arm, initial encounter: Secondary | ICD-10-CM | POA: Insufficient documentation

## 2013-12-11 DIAGNOSIS — Y92410 Unspecified street and highway as the place of occurrence of the external cause: Secondary | ICD-10-CM | POA: Insufficient documentation

## 2013-12-11 DIAGNOSIS — S199XXA Unspecified injury of neck, initial encounter: Principal | ICD-10-CM

## 2013-12-11 DIAGNOSIS — S0993XA Unspecified injury of face, initial encounter: Secondary | ICD-10-CM | POA: Insufficient documentation

## 2013-12-11 DIAGNOSIS — Y9389 Activity, other specified: Secondary | ICD-10-CM | POA: Insufficient documentation

## 2013-12-11 DIAGNOSIS — S99919A Unspecified injury of unspecified ankle, initial encounter: Secondary | ICD-10-CM

## 2013-12-11 MED ORDER — NAPROXEN 500 MG PO TABS: 500.0000 mg | ORAL_TABLET | Freq: Two times a day (BID) | ORAL | Status: DC

## 2013-12-11 MED ORDER — CYCLOBENZAPRINE HCL 5 MG PO TABS: 5.0000 mg | ORAL_TABLET | Freq: Three times a day (TID) | ORAL | Status: DC | PRN

## 2013-12-11 MED ORDER — ACETAMINOPHEN 325 MG PO TABS
650.0000 mg | ORAL_TABLET | Freq: Once | ORAL | Status: AC
  Administered 2013-12-11: 650 mg via ORAL
  Filled 2013-12-11: qty 2

## 2013-12-11 NOTE — ED Notes (Signed)
PA at bedside.
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
## 2013-12-11 NOTE — Discharge Instructions (Signed)
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 Hotline (615)[phone] or [phone] (After Hours)

## 2013-12-13 NOTE — ED Provider Notes (Signed)
Medical screening examination/treatment/procedure(s) were performed by non-physician practitioner and as supervising physician I was immediately available for consultation/collaboration.   EKG Interpretation   Date/Time:  Friday December 11 2013 14:44:08 EST Ventricular Rate:  88 PR Interval:  150 QRS Duration: 79 QT Interval:  361 QTC Calculation: 437 R Axis:   106 Text Interpretation:  Sinus rhythm Borderline right axis deviation No  previous tracing Confirmed by Anitra LauthPLUNKETT  MD, Ganesh Deeg ([id number]) on 12/11/2013  2:57:01 PM        Gwyneth SproutWhitney Dorrie Cocuzza, MD 12/13/13 [phone]

## 2013-12-17 ENCOUNTER — Encounter (HOSPITAL_COMMUNITY): Payer: Self-pay | Admitting: Emergency Medicine

## 2013-12-17 ENCOUNTER — Emergency Department (INDEPENDENT_AMBULATORY_CARE_PROVIDER_SITE_OTHER)
Admission: EM | Admit: 2013-12-17 | Discharge: 2013-12-17 | Disposition: A | Discharge status: Home / Self Care | Payer: Self-pay | Source: Home / Self Care | Attending: Family Medicine | Admitting: Family Medicine

## 2013-12-17 DIAGNOSIS — M545 Low back pain, unspecified: Secondary | ICD-10-CM

## 2013-12-17 DIAGNOSIS — M542 Cervicalgia: Secondary | ICD-10-CM

## 2013-12-17 MED ORDER — TRAMADOL HCL 50 MG PO TABS: 50.0000 mg | ORAL_TABLET | Freq: Two times a day (BID) | ORAL | Status: DC | PRN
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
## 2013-12-17 NOTE — ED Provider Notes (Signed)
Medical screening examination/treatment/procedure(s) were performed by a resident physician or non-physician practitioner and as the supervising physician I was immediately available for consultation/collaboration.  Tiant Peixoto, MD    Dosha Broshears S Hutch Rhett, MD 12/17/13 1412 

## 2013-12-17 NOTE — ED Notes (Signed)
[facility] States she was told from ER to follow up with community health wellness States she received medications but not taking away pain; she would like another pain medication
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
## 2014-07-15 ENCOUNTER — Encounter (HOSPITAL_BASED_OUTPATIENT_CLINIC_OR_DEPARTMENT_OTHER): Payer: Self-pay | Admitting: Emergency Medicine

## 2014-07-15 ENCOUNTER — Emergency Department (HOSPITAL_BASED_OUTPATIENT_CLINIC_OR_DEPARTMENT_OTHER)
Admission: EM | Admit: 2014-07-15 | Discharge: 2014-07-15 | Disposition: A | Discharge status: Home / Self Care | Payer: Self-pay | Attending: Emergency Medicine | Admitting: Emergency Medicine

## 2014-07-15 DIAGNOSIS — J3489 Other specified disorders of nose and nasal sinuses: Secondary | ICD-10-CM | POA: Insufficient documentation

## 2014-07-15 DIAGNOSIS — H9209 Otalgia, unspecified ear: Secondary | ICD-10-CM | POA: Insufficient documentation

## 2014-07-15 DIAGNOSIS — R42 Dizziness and giddiness: Secondary | ICD-10-CM | POA: Insufficient documentation

## 2014-07-15 DIAGNOSIS — Z3202 Encounter for pregnancy test, result negative: Secondary | ICD-10-CM | POA: Insufficient documentation

## 2014-07-15 LAB — URINALYSIS, ROUTINE W REFLEX MICROSCOPIC
Bilirubin Urine: NEGATIVE
Glucose, UA: NEGATIVE mg/dL
HGB URINE DIPSTICK: NEGATIVE
Ketones, ur: 15 mg/dL — AB
LEUKOCYTES UA: NEGATIVE
Nitrite: NEGATIVE
PROTEIN: NEGATIVE mg/dL
SPECIFIC GRAVITY, URINE: 1.019 (ref 1.005–1.030)
Urobilinogen, UA: 0.2 mg/dL (ref 0.0–1.0)
pH: 6 (ref 5.0–8.0)

## 2014-07-15 LAB — PREGNANCY, URINE: Preg Test, Ur: NEGATIVE

## 2014-07-15 MED ORDER — MECLIZINE HCL 25 MG PO TABS: 25.0000 mg | ORAL_TABLET | Freq: Three times a day (TID) | ORAL | Status: AC | PRN

## 2014-07-15 MED ORDER — PHENYLEPHRINE-ACETAMINOPHEN 5-325 MG PO CAPS: 1.0000 | ORAL_CAPSULE | Freq: Two times a day (BID) | ORAL | Status: DC | PRN

## 2014-07-15 MED ORDER — MECLIZINE HCL 25 MG PO TABS
25.0000 mg | ORAL_TABLET | Freq: Once | ORAL | Status: AC
  Administered 2014-07-15: 25 mg via ORAL
  Filled 2014-07-15: qty 1

## 2014-07-15 MED ORDER — SALINE SPRAY 0.65 % NA SOLN
1.0000 | Freq: Once | NASAL | Status: AC
  Administered 2014-07-15: 1 via NASAL
  Filled 2014-07-15: qty 44

## 2014-07-15 NOTE — ED Notes (Signed)
Pressure in her ears and dizziness.

## 2014-07-15 NOTE — ED Provider Notes (Signed)
CSN: [id number]     Arrival date & time 07/15/14  1812 History   First MD Initiated Contact with Patient 07/15/14 2004     Chief Complaint  Patient presents with  . URI     (Consider location/radiation/quality/duration/timing/severity/associated sxs/prior Treatment) HPI Comments: Patient is a 28 yo F presenting to the ED for 1-2 days for left sided sinus and ear pressure with dizziness. Patient states her symptoms are worsened with position change. No alleviating factors. Patient states she tried one Unisom prior to arrival as well as for symptoms. Denies any fevers, chills nausea, vomiting, chest pain, shortness of breath, syncope. No sick contacts. Patient states she's had vertigo in the past related with sinus infection states her symptoms feel similar to previous episode.  Patient is a 28 y.o. female presenting with URI.  URI Presenting symptoms: ear pain     History reviewed. No pertinent past medical history. History reviewed. No pertinent past surgical history. No family history on file. History  Substance Use Topics  . Smoking status: Never Smoker   . Smokeless tobacco: Not on file  . Alcohol Use: No   OB History   Grav Para Term Preterm Abortions TAB SAB Ect Mult Living                 Review of Systems  HENT: Positive for ear pain and sinus pressure.   Neurological: Positive for dizziness. Negative for syncope.  All other systems reviewed and are negative.     Allergies  Review of patient's allergies indicates no known allergies.  Home Medications   Prior to Admission medications   Medication Sig Start Date End Date Taking? Authorizing Provider  meclizine (ANTIVERT) 25 MG tablet Take 1 tablet (25 mg total) by mouth 3 (three) times daily as needed for dizziness. 07/15/14   Arnol Mcgibbon L Skylen Danielsen, PA-C  Phenylephrine-Acetaminophen (VICKS DAYQUIL SINEX) 5-325 MG CAPS Take 1 tablet by mouth every 12 (twelve) hours as needed. 07/15/14   Feiga Nadel L Shlomie Romig, PA-C    BP 115/82  Pulse 88  Temp(Src) 98.5 F (36.9 C) (Oral)  Resp 16  Ht 5\' 3"  (1.6 m)  Wt 172 lb (78.019 kg)  BMI 30.48 kg/m2  SpO2 100%  LMP 07/08/2014 Physical Exam  Nursing note and vitals reviewed. Constitutional: She is oriented to person, place, and time. She appears well-developed and well-nourished. No distress.  HENT:  Head: Normocephalic and atraumatic.  Right Ear: Hearing, tympanic membrane, external ear and ear canal normal.  Left Ear: Hearing, tympanic membrane, external ear and ear canal normal.  Nose: Nose normal. Right sinus exhibits no maxillary sinus tenderness and no frontal sinus tenderness. Left sinus exhibits no maxillary sinus tenderness and no frontal sinus tenderness.  Mouth/Throat: Uvula is midline, oropharynx is clear and moist and mucous membranes are normal. No oropharyngeal exudate.  Nasal congestion.   Eyes: Conjunctivae and EOM are normal. Pupils are equal, round, and reactive to light.  Neck: Normal range of motion. Neck supple.  Cardiovascular: Normal rate, regular rhythm, normal heart sounds and intact distal pulses.   Pulmonary/Chest: Effort normal and breath sounds normal. No respiratory distress.  Abdominal: Soft. There is no tenderness.  Neurological: She is alert and oriented to person, place, and time. She has normal strength. No cranial nerve deficit. She displays a negative Romberg sign. Gait normal. GCS eye subscore is 4. GCS verbal subscore is 5. GCS motor subscore is 6.  Sensation grossly intact.  No pronator drift.  Bilateral heel-knee-shin intact. Bilateral finger  to nose to finger intact.  Skin: Skin is warm and dry. She is not diaphoretic.    ED Course  Procedures (including critical care time) Medications  meclizine (ANTIVERT) tablet 25 mg (25 mg Oral Given 07/15/14 2037)  sodium chloride (OCEAN) 0.65 % nasal spray 1 spray (1 spray Each Nare Given 07/15/14 2037)    Labs Review Labs Reviewed  URINALYSIS, ROUTINE W REFLEX  MICROSCOPIC - Abnormal; Notable for the following:    Ketones, ur 15 (*)    All other components within normal limits  PREGNANCY, URINE    Imaging Review No results found.   EKG Interpretation   Date/Time:  Thursday July 15 2014 20:53:19 EDT Ventricular Rate:  87 PR Interval:  162 QRS Duration: 80 QT Interval:  382 QTC Calculation: 459 R Axis:   98 Text Interpretation:  Normal sinus rhythm Rightward axis Borderline ECG No  previous tracing Confirmed by POLLINA  MD, CHRISTOPHER [phone]) on  07/15/2014 9:10:05 PM      MDM   Final diagnoses:  Sinus pressure  Dizziness    Filed Vitals:   07/15/14 2142  BP: 115/82  Pulse: 88  Temp:   Resp: 16   Afebrile, NAD, non-toxic appearing, AAOx4. No neuro focal deficits on examination. Dizziness improved with Antivert. Will concern for a central cause of dizziness. Symptoms likely related to onset of sinusitis head cold. Pregnancy test negative. ED EKG unremarkable. Return precautions discussed. Symptomatic measures discussed. Patient is agreeable to plan. Patient is stable at time of discharge.       Jeannetta Ellis, PA-C 07/15/14 2148

## 2014-07-15 NOTE — Discharge Instructions (Signed)
Please follow up with your primary care physician in 1-2 days. If you do not have one please call the [facility] number listed above. Please use medications as prescribed. Please read all discharge instructions and return precautions.   Sinus Headache A sinus headache is when your sinuses become clogged or swollen. Sinus headaches can range from mild to severe.  CAUSES A sinus headache can have different causes, such as:  Colds.  Sinus infections.  Allergies. SYMPTOMS  Symptoms of a sinus headache may vary and can include:  Headache.  Pain or pressure in the face.  Congested or runny nose.  Fever.  Inability to smell.  Pain in upper teeth. Weather changes can make symptoms worse. TREATMENT  The treatment of a sinus headache depends on the cause.  Sinus pain caused by a sinus infection may be treated with antibiotic medicine.  Sinus pain caused by allergies may be helped by allergy medicines (antihistamines) and medicated nasal sprays.  Sinus pain caused by congestion may be helped by flushing the nose and sinuses with saline solution. HOME CARE INSTRUCTIONS   If antibiotics are prescribed, take them as directed. Finish them even if you start to feel better.  Only take over-the-counter or prescription medicines for pain, discomfort, or fever as directed by your caregiver.  If you have congestion, use a nasal spray to help reduce pressure. SEEK IMMEDIATE MEDICAL CARE IF:  You have a fever.  You have headaches more than once a week.  You have sensitivity to light or sound.  You have repeated nausea and vomiting.  You have vision problems.  You have sudden, severe pain in your face or head.  You have a seizure.  You are confused.  Your sinus headaches do not get better after treatment. Many people think they have a sinus headache when they actually have migraines or tension headaches. MAKE SURE YOU:   Understand these  instructions.  Will watch your condition.  Will get help right away if you are not doing well or get worse. Document Released: 11/01/2004 Document Revised: 12/17/2011 Document Reviewed: 12/23/2010 [facility] Patient Information 2015 [employer] [place]. This information is not intended to replace advice given to you by your health care provider. Make sure you discuss any questions you have with your health care provider.  Vertigo Vertigo means you feel like you or your surroundings are moving when they are not. Vertigo can be dangerous if it occurs when you are at work, driving, or performing difficult activities.  CAUSES  Vertigo occurs when there is a conflict of signals sent to your brain from the visual and sensory systems in your body. There are many different causes of vertigo, including:  Infections, especially in the inner ear.  A bad reaction to a drug or misuse of alcohol and medicines.  Withdrawal from drugs or alcohol.  Rapidly changing positions, such as lying down or rolling over in bed.  A migraine headache.  Decreased blood flow to the brain.  Increased pressure in the brain from a head injury, infection, tumor, or bleeding. SYMPTOMS  You may feel as though the world is spinning around or you are falling to the ground. Because your balance is upset, vertigo can cause nausea and vomiting. You may have involuntary eye movements (nystagmus). DIAGNOSIS  Vertigo is usually diagnosed by physical exam. If the cause of your vertigo is unknown, your caregiver may perform imaging tests, such as an MRI scan (magnetic resonance imaging). TREATMENT  Most cases of  vertigo resolve on their own, without treatment. Depending on the cause, your caregiver may prescribe certain medicines. If your vertigo is related to body position issues, your caregiver may recommend movements or procedures to correct the problem. In rare cases, if your vertigo is caused by certain inner ear problems, you may  need surgery. HOME CARE INSTRUCTIONS   Follow your caregiver's instructions.  Avoid driving.  Avoid operating heavy machinery.  Avoid performing any tasks that would be dangerous to you or others during a vertigo episode.  Tell your caregiver if you notice that certain medicines seem to be causing your vertigo. Some of the medicines used to treat vertigo episodes can actually make them worse in some people. SEEK IMMEDIATE MEDICAL CARE IF:   Your medicines do not relieve your vertigo or are making it worse.  You develop problems with talking, walking, weakness, or using your arms, hands, or legs.  You develop severe headaches.  Your nausea or vomiting continues or gets worse.  You develop visual changes.  A family member notices behavioral changes.  Your condition gets worse. MAKE SURE YOU:  Understand these instructions.  Will watch your condition.  Will get help right away if you are not doing well or get worse. Document Released: 07/04/2005 Document Revised: 12/17/2011 Document Reviewed: 04/12/2011 [facility] Patient Information 2015 [place], [place]. This information is not intended to replace advice given to you by your health care provider. Make sure you discuss any questions you have with your health care provider.

## 2014-07-15 NOTE — ED Notes (Signed)
C/o dizziness/nausea ha off and on x 1 week  Left ear pain nasel drainage when bending over  Denies chills/fever

## 2014-07-16 NOTE — ED Provider Notes (Signed)
Medical screening examination/treatment/procedure(s) were performed by non-physician practitioner and as supervising physician I was immediately available for consultation/collaboration.  Gilda Creasehristopher J. Pollina, MD 07/16/14 [phone]

## 2014-07-21 ENCOUNTER — Encounter (HOSPITAL_COMMUNITY): Payer: Self-pay | Admitting: Emergency Medicine

## 2014-08-04 ENCOUNTER — Encounter (HOSPITAL_COMMUNITY): Payer: Self-pay | Admitting: *Deleted

## 2014-08-04 ENCOUNTER — Inpatient Hospital Stay (HOSPITAL_COMMUNITY)
Admission: AD | Admit: 2014-08-04 | Discharge: 2014-08-04 | Disposition: A | Discharge status: Home / Self Care | Payer: No Typology Code available for payment source | Source: Ambulatory Visit | Attending: Obstetrics & Gynecology | Admitting: Obstetrics & Gynecology

## 2014-08-04 DIAGNOSIS — N390 Urinary tract infection, site not specified: Secondary | ICD-10-CM

## 2014-08-04 HISTORY — DX: Dizziness and giddiness: R42

## 2014-08-04 LAB — URINE MICROSCOPIC-ADD ON

## 2014-08-04 LAB — URINALYSIS, ROUTINE W REFLEX MICROSCOPIC
BILIRUBIN URINE: NEGATIVE
GLUCOSE, UA: NEGATIVE mg/dL
Ketones, ur: NEGATIVE mg/dL
Nitrite: POSITIVE — AB
PH: 6.5 (ref 5.0–8.0)
Protein, ur: NEGATIVE mg/dL
Specific Gravity, Urine: 1.015 (ref 1.005–1.030)
Urobilinogen, UA: 0.2 mg/dL (ref 0.0–1.0)

## 2014-08-04 LAB — POCT PREGNANCY, URINE: Preg Test, Ur: NEGATIVE

## 2014-08-04 MED ORDER — PHENAZOPYRIDINE HCL 200 MG PO TABS: 200.0000 mg | ORAL_TABLET | Freq: Three times a day (TID) | ORAL | Status: AC

## 2014-08-04 MED ORDER — SULFAMETHOXAZOLE-TRIMETHOPRIM 800-160 MG PO TABS: 1.0000 | ORAL_TABLET | Freq: Two times a day (BID) | ORAL | Status: AC

## 2014-08-04 NOTE — MAU Provider Note (Signed)
History     CSN: [id number]  Arrival date and time: 08/04/14 [id number]   None     Chief Complaint  Patient presents with  . Urinary Frequency   HPI    Ms. [name] is a 28 y.o.female G1P1001 who presents with UTI symptoms including; frequency, urgency and lower back pain.   OB History   Grav Para Term Preterm Abortions TAB SAB Ect Mult Living   1 1 1  0 0 0 0 0 0 1      Past Medical History  Diagnosis Date  . No pertinent past medical history   . Vertigo     Past Surgical History  Procedure Laterality Date  . Cesarean section      History reviewed. No pertinent family history.  History  Substance Use Topics  . Smoking status: Never Smoker   . Smokeless tobacco: Not on file  . Alcohol Use: No    Allergies: No Known Allergies  Prescriptions prior to admission  Medication Sig Dispense Refill  . ibuprofen (ADVIL,MOTRIN) 200 MG tablet Take 400 mg by mouth every 6 (six) hours as needed for mild pain.       . meclizine (ANTIVERT) 25 MG tablet Take 1 tablet (25 mg total) by mouth 3 (three) times daily as needed for dizziness.  15 tablet  0  . pseudoephedrine (SUDAFED) 120 MG 12 hr tablet Take 120 mg by mouth every 12 (twelve) hours as needed for congestion.       Results for orders placed during the hospital encounter of 08/04/14 (from the past 48 hour(s))  URINALYSIS, ROUTINE W REFLEX MICROSCOPIC     Status: Abnormal   Collection Time    08/04/14  7:42 AM      Result Value Ref Range   Color, Urine YELLOW  YELLOW   APPearance CLEAR  CLEAR   Specific Gravity, Urine 1.015  1.005 - 1.030   pH 6.5  5.0 - 8.0   Glucose, UA NEGATIVE  NEGATIVE mg/dL   Hgb urine dipstick MODERATE (*) NEGATIVE   Bilirubin Urine NEGATIVE  NEGATIVE   Ketones, ur NEGATIVE  NEGATIVE mg/dL   Protein, ur NEGATIVE  NEGATIVE mg/dL   Urobilinogen, UA 0.2  0.0 - 1.0 mg/dL   Nitrite POSITIVE (*) NEGATIVE   Leukocytes, UA SMALL (*) NEGATIVE  URINE MICROSCOPIC-ADD ON     Status: Abnormal   Collection Time    08/04/14  7:42 AM      Result Value Ref Range   Squamous Epithelial / LPF RARE  RARE   WBC, UA 7-10  <3 WBC/hpf   RBC / HPF 11-20  <3 RBC/hpf   Bacteria, UA FEW (*) RARE  POCT PREGNANCY, URINE     Status: None   Collection Time    08/04/14  7:47 AM      Result Value Ref Range   Preg Test, Ur NEGATIVE  NEGATIVE   Comment:            THE SENSITIVITY OF THIS     METHODOLOGY IS >24 mIU/mL    Review of Systems  Constitutional: Negative for fever and chills.  Gastrointestinal: Positive for abdominal pain (After using the bathroom ).  Genitourinary: Positive for urgency and frequency. Negative for dysuria (unusual sensation in vaginal area. ), hematuria and flank pain.  Musculoskeletal: Positive for back pain (Lower back . ).   Physical Exam   Blood pressure 113/74, pulse 95, temperature 98 F (36.7 C), temperature source Oral, resp. rate  18, last menstrual period 07/07/2014.  Physical Exam  Constitutional: She is oriented to person, place, and time. She appears well-developed and well-nourished. No distress.  HENT:  Head: Normocephalic.  Eyes: Pupils are equal, round, and reactive to light.  Neck: Neck supple.  Respiratory: Effort normal.  GI: Soft. There is no tenderness. There is no CVA tenderness.  Musculoskeletal: Normal range of motion.  Neurological: She is alert and oriented to person, place, and time.  Skin: Skin is warm. She is not diaphoretic.  Psychiatric: Her behavior is normal.    MAU Course  Procedures None  MDM UA Upt  Urine culture pending   Assessment and Plan   A: 1. UTI (lower urinary tract infection)    P: Discharge home in stable condition RX: bactrim, pyridium  Return to MAU if symptoms worsen   Brandi HansenJennifer Irene [name], NP 08/04/2014 4:05 PM

## 2014-08-04 NOTE — MAU Note (Signed)
Symptoms started on Monday.  Urinary frequency, irritated feeling with urination.  Did take some AZO

## 2014-08-07 LAB — URINE CULTURE: Special Requests: NORMAL

## 2014-08-09 ENCOUNTER — Encounter (HOSPITAL_COMMUNITY): Payer: Self-pay | Admitting: *Deleted
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
## 2016-08-28 ENCOUNTER — Emergency Department (HOSPITAL_BASED_OUTPATIENT_CLINIC_OR_DEPARTMENT_OTHER)
Admission: EM | Admit: 2016-08-28 | Discharge: 2016-08-28 | Disposition: A | Discharge status: Home / Self Care | Payer: Self-pay | Attending: Emergency Medicine | Admitting: Emergency Medicine

## 2016-08-28 ENCOUNTER — Encounter (HOSPITAL_BASED_OUTPATIENT_CLINIC_OR_DEPARTMENT_OTHER): Payer: Self-pay | Admitting: Emergency Medicine

## 2016-08-28 DIAGNOSIS — R42 Dizziness and giddiness: Secondary | ICD-10-CM | POA: Insufficient documentation

## 2016-08-28 DIAGNOSIS — Z79899 Other long term (current) drug therapy: Secondary | ICD-10-CM | POA: Insufficient documentation

## 2016-08-28 LAB — BASIC METABOLIC PANEL
ANION GAP: 8 (ref 5–15)
BUN: 15 mg/dL (ref 6–20)
CALCIUM: 9.2 mg/dL (ref 8.9–10.3)
CHLORIDE: 101 mmol/L (ref 101–111)
CO2: 27 mmol/L (ref 22–32)
Creatinine, Ser: 0.76 mg/dL (ref 0.44–1.00)
GFR calc Af Amer: >60 mL/min (ref >60)
GFR calc non Af Amer: >60 mL/min (ref >60)
Glucose, Bld: 110 mg/dL — ABNORMAL HIGH (ref 65–99)
POTASSIUM: 3.9 mmol/L (ref 3.5–5.1)
Sodium: 136 mmol/L (ref 135–145)

## 2016-08-28 MED ORDER — MECLIZINE HCL 12.5 MG PO TABS: 12.5000 mg | ORAL_TABLET | Freq: Three times a day (TID) | ORAL | 0 refills | Status: AC | PRN

## 2016-08-28 MED ORDER — MECLIZINE HCL 25 MG PO TABS
25.0000 mg | ORAL_TABLET | Freq: Once | ORAL | Status: AC
  Administered 2016-08-28: 25 mg via ORAL
  Filled 2016-08-28: qty 1

## 2016-08-28 NOTE — ED Provider Notes (Signed)
Emergency Department Provider Note   I have reviewed the triage vital signs and the nursing notes.   HISTORY  Chief Complaint Dizziness   HPI [name] is a 30 y.o. female with PMH of vertigo presents to the emergency department for evaluation of intermittent vertigo over the past 2 days. The patient had a remote history of similar symptoms that resolved spontaneously. When symptoms began yesterday she took a Dramamine with only mild relief. She had symptoms today while driving her car. She reports worsening vertigo with movement. No gait instability. No weakness or numbness in her arms or legs. No voice changes. She denies ear pain or face pain. No recent infections.   Past Medical History:  Diagnosis Date  . No pertinent past medical history   . Vertigo     There are no active problems to display for this patient.   Past Surgical History:  Procedure Laterality Date  . CESAREAN SECTION      Current Outpatient Rx  . Order #: [id number] Class: Historical Med  . Order #: [id number] Class: Historical Med  . Order #: [id number] Class: Print  . Order #: [id number] Class: Print  . Order #: [id number] Class: Normal  . Order #: [id number] Class: Historical Med    Allergies Aspirin  History reviewed. No pertinent family history.  Social History Social History  Substance Use Topics  . Smoking status: Never Smoker  . Smokeless tobacco: Never Used  . Alcohol use No     Comment: occ    Review of Systems  Constitutional: No fever/chills Eyes: No visual changes. ENT: No sore throat. Positive intermittent vertigo.  Cardiovascular: Denies chest pain. Respiratory: Denies shortness of breath. Gastrointestinal: No abdominal pain. Positive nausea, no vomiting.  No diarrhea.  No constipation. Genitourinary: Negative for dysuria. Musculoskeletal: Negative for back pain. Skin: Negative for rash. Neurological: Negative for headaches, focal weakness or numbness.  10-point ROS  otherwise negative.  ____________________________________________   PHYSICAL EXAM:  VITAL SIGNS: ED Triage Vitals [08/28/16 1519]  Enc Vitals Group     BP 116/85     Pulse Rate 81     Resp 18     Temp 99.6 F (37.6 C)     Temp Source Oral     SpO2 99 %     Weight 178 lb (80.7 kg)     Height 5\' 3"  (1.6 m)    Constitutional: Alert and oriented. Well appearing and in no acute distress. Eyes: Conjunctivae are normal.  Head: Atraumatic. Ears:  Healthy appearing ear canals with trace fluid behind left TM. No erythema or bulging.  Nose: No congestion/rhinnorhea. Mouth/Throat: Mucous membranes are moist.  Oropharynx non-erythematous. Neck: No stridor.   Cardiovascular: Normal rate, regular rhythm. Good peripheral circulation. Grossly normal heart sounds.   Respiratory: Normal respiratory effort.  No retractions. Lungs CTAB. Gastrointestinal: Soft and nontender. No distention.  Musculoskeletal: No lower extremity tenderness nor edema. No gross deformities of extremities. Neurologic:  Normal speech and language. No gross focal neurologic deficits are appreciated. Normal finger-to-nose and heel-to-shin. Steady gait. Normal rapid alternating movements.  Skin:  Skin is warm, dry and intact. No rash noted.   ____________________________________________   LABS (all labs ordered are listed, but only abnormal results are displayed)  Labs Reviewed  BASIC METABOLIC PANEL - Abnormal; Notable for the following:       Result Value   Glucose, Bld 110 (*)    All other components within normal limits   ____________________________________________  EKG   EKG Interpretation  Date/Time:  Tuesday August 28 2016 16:39:36 EST Ventricular Rate:  80 PR Interval:    QRS Duration: 80 QT Interval:  397 QTC Calculation: 458 R Axis:   103 Text Interpretation:  Sinus rhythm Borderline right axis deviation Borderline T abnormalities, anterior leads No STEMI.  Similar to 2015 tracing.  Confirmed  by LONG MD, JOSHUA [phone]) on 08/28/2016 5:15:03 PM       ____________________________________________   PROCEDURES  Procedure(s) performed:   Procedures  None ____________________________________________   INITIAL IMPRESSION / ASSESSMENT AND PLAN / ED COURSE  Pertinent labs & imaging results that were available during my care of the patient were reviewed by me and considered in my medical decision making (see chart for details).  Patient resents emergency department for evaluation of intermittent vertigo. Her symptoms are sudden and severe in onset and appear to be fatigable by history. She has intact neurological exam including normal cerebellar function. Suspect that this is peripheral vertigo. Unable to elicit vertigo with head movement. No hearing loss. The patient does have a small amount of fluid behind the left TM but no erythema or bulging. Do not feel this requires advanced head imaging. Plan for symptomatically management at home and likely ENT follow-up. We'll obtain baseline metabolic panel to rule out electrolyte abnormality.  17:22 Labs resulted with no significant abnormality. Patient has had no symptoms in the ED. Plan for symptom mgmt at home. Gave contact information for ENT given second episode of vertigo.   At this time, I do not feel there is any life-threatening condition present. I have reviewed and discussed all results (EKG, imaging, lab, urine as appropriate), exam findings with patient. I have reviewed nursing notes and appropriate previous records.  I feel the patient is safe to be discharged home without further emergent workup. Discussed usual and customary return precautions. Patient and family (if present) verbalize understanding and are comfortable with this plan.  Patient will follow-up with their primary care provider. If they do not have a primary care provider, information for follow-up has been provided to them. All questions have been  answered.  ____________________________________________  FINAL CLINICAL IMPRESSION(S) / ED DIAGNOSES  Final diagnoses:  Vertigo     MEDICATIONS GIVEN DURING THIS VISIT:  Medications  meclizine (ANTIVERT) tablet 25 mg (25 mg Oral Given 08/28/16 1702)     NEW OUTPATIENT MEDICATIONS STARTED DURING THIS VISIT:  Discharge Medication List as of 08/28/2016  5:44 PM    START taking these medications   Details  !! meclizine (ANTIVERT) 12.5 MG tablet Take 1 tablet (12.5 mg total) by mouth 3 (three) times daily as needed for dizziness., Starting Tue 08/28/2016, Print     !! - Potential duplicate medications found. Please discuss with provider.      Note:  This document was prepared using Dragon voice recognition software and may include unintentional dictation errors.  Alona BeneJoshua Long, MD Emergency Medicine   Maia PlanJoshua G Long, MD 08/29/16 [phone]

## 2016-08-28 NOTE — Discharge Instructions (Signed)
We believe your symptoms were caused by benign vertigo.  Please read through the included information and take any prescribed medication(s).  Follow up with your doctor as listed above.  If you develop any new or worsening symptoms that concern you, including but not limited to persistent dizziness/vertigo, numbness or weakness in your arms or legs, altered mental status, persistent vomiting, or fever greater than 101, please return immediately to the Emergency Department.  

## 2016-08-28 NOTE — ED Notes (Signed)
ED Provider at bedside. 

## 2016-08-28 NOTE — ED Triage Notes (Signed)
Patient with hx of vertigo. Patient states she has been having intermittent dizziness x 2 weeks. Patient took dramamine and the dizziness resolved. Patient states this episode started today and the dramamine is not helping. Patient states this feels like her vertigo.

## 2016-08-28 NOTE — ED Notes (Addendum)
Pt states dizziness started yesterday while driving, worse when moving her head, denies N/V, denies pain. Denies weakness in extremities.

## 2016-12-08 DIAGNOSIS — H04123 Dry eye syndrome of bilateral lacrimal glands: Secondary | ICD-10-CM | POA: Diagnosis not present

## 2016-12-08 DIAGNOSIS — H40033 Anatomical narrow angle, bilateral: Secondary | ICD-10-CM | POA: Diagnosis not present

## 2017-01-19 DIAGNOSIS — H04123 Dry eye syndrome of bilateral lacrimal glands: Secondary | ICD-10-CM | POA: Diagnosis not present

## 2017-09-24 DIAGNOSIS — N926 Irregular menstruation, unspecified: Secondary | ICD-10-CM | POA: Diagnosis not present

## 2017-09-24 DIAGNOSIS — N760 Acute vaginitis: Secondary | ICD-10-CM | POA: Diagnosis not present

## 2018-05-31 DIAGNOSIS — N944 Primary dysmenorrhea: Secondary | ICD-10-CM | POA: Diagnosis not present

## 2018-08-08 DIAGNOSIS — Z23 Encounter for immunization: Secondary | ICD-10-CM | POA: Diagnosis not present

## 2019-08-04 ENCOUNTER — Other Ambulatory Visit: Payer: Self-pay

## 2019-08-04 ENCOUNTER — Encounter (HOSPITAL_BASED_OUTPATIENT_CLINIC_OR_DEPARTMENT_OTHER): Payer: Self-pay | Admitting: *Deleted

## 2019-08-04 ENCOUNTER — Emergency Department (HOSPITAL_BASED_OUTPATIENT_CLINIC_OR_DEPARTMENT_OTHER)
Admission: EM | Admit: 2019-08-04 | Discharge: 2019-08-04 | Disposition: A | Discharge status: Home / Self Care | Payer: 59 | Attending: Emergency Medicine | Admitting: Emergency Medicine

## 2019-08-04 DIAGNOSIS — B9689 Other specified bacterial agents as the cause of diseases classified elsewhere: Secondary | ICD-10-CM

## 2019-08-04 DIAGNOSIS — N760 Acute vaginitis: Secondary | ICD-10-CM | POA: Diagnosis not present

## 2019-08-04 DIAGNOSIS — N390 Urinary tract infection, site not specified: Secondary | ICD-10-CM | POA: Insufficient documentation

## 2019-08-04 DIAGNOSIS — Z79899 Other long term (current) drug therapy: Secondary | ICD-10-CM | POA: Insufficient documentation

## 2019-08-04 DIAGNOSIS — R300 Dysuria: Secondary | ICD-10-CM | POA: Diagnosis present

## 2019-08-04 LAB — URINALYSIS, ROUTINE W REFLEX MICROSCOPIC
Bilirubin Urine: NEGATIVE
Glucose, UA: NEGATIVE mg/dL
Ketones, ur: 15 mg/dL — AB
Nitrite: NEGATIVE
Protein, ur: NEGATIVE mg/dL
Specific Gravity, Urine: 1.02 (ref 1.005–1.030)
pH: 6.5 (ref 5.0–8.0)

## 2019-08-04 LAB — URINALYSIS, MICROSCOPIC (REFLEX)

## 2019-08-04 LAB — WET PREP, GENITAL
Sperm: NONE SEEN
Trich, Wet Prep: NONE SEEN
Yeast Wet Prep HPF POC: NONE SEEN

## 2019-08-04 LAB — PREGNANCY, URINE: Preg Test, Ur: NEGATIVE

## 2019-08-04 MED ORDER — METRONIDAZOLE 500 MG PO TABS: 500.0000 mg | ORAL_TABLET | Freq: Two times a day (BID) | ORAL | 0 refills | Status: AC

## 2019-08-04 MED ORDER — FOSFOMYCIN TROMETHAMINE 3 G PO PACK
3.0000 g | PACK | Freq: Once | ORAL | Status: AC
  Administered 2019-08-04: 3 g via ORAL
  Filled 2019-08-04: qty 3

## 2019-08-04 MED ORDER — CEFTRIAXONE SODIUM 250 MG IJ SOLR
250.0000 mg | Freq: Once | INTRAMUSCULAR | Status: AC
Start: 2019-08-04 — End: 2019-08-04
  Administered 2019-08-04: 250 mg via INTRAMUSCULAR
  Filled 2019-08-04: qty 250

## 2019-08-04 MED ORDER — AZITHROMYCIN 1 G PO PACK
1.0000 g | PACK | Freq: Once | ORAL | Status: AC
  Administered 2019-08-04: 1 g via ORAL
  Filled 2019-08-04: qty 1

## 2019-08-04 NOTE — ED Triage Notes (Addendum)
Pt c/o painful freq urination x 1 day also c/o lower bil back pain

## 2019-08-06 LAB — GC/CHLAMYDIA PROBE AMP (~~LOC~~) NOT AT ARMC
Chlamydia: NEGATIVE
Neisseria Gonorrhea: NEGATIVE

## 2019-08-06 NOTE — ED Provider Notes (Signed)
[facility] EMERGENCY DEPARTMENT Provider Note   CSN: [id number] Arrival date & time: 08/04/19  1816     History   Chief Complaint Chief Complaint  Patient presents with  . Dysuria    HPI [name] is a 33 y.o. female.     HPI   33yo female with no significant medical history presents with concern for dysuria and frequent urination.  Reports has been going on for one day. ALso notes bilateral lower back cramping. No n/v/fevers/diarrhea/constipation. Denies abdominal pain with exception of mild suprapubic pain with urinary symptoms.  Has had vaginal itching/discomfort and some discharge.  Past Medical History:  Diagnosis Date  . No pertinent past medical history   . Vertigo     There are no active problems to display for this patient.   Past Surgical History:  Procedure Laterality Date  . CESAREAN SECTION       OB History    Gravida  1   Para  1   Term  1   Preterm  0   AB  0   Living  1     SAB  0   TAB  0   Ectopic  0   Multiple  0   Live Births               Home Medications    Prior to Admission medications   Medication Sig Start Date End Date Taking? Authorizing Provider  dimenhyDRINATE (DRAMAMINE) 50 MG tablet Take 50 mg by mouth every 8 (eight) hours as needed.    [provider]  ibuprofen (ADVIL,MOTRIN) 200 MG tablet Take 400 mg by mouth every 6 (six) hours as needed for mild pain.     [provider]  meclizine (ANTIVERT) 12.5 MG tablet Take 1 tablet (12.5 mg total) by mouth 3 (three) times daily as needed for dizziness. 08/28/16   Long, Arlyss RepressJoshua G, MD  meclizine (ANTIVERT) 25 MG tablet Take 1 tablet (25 mg total) by mouth 3 (three) times daily as needed for dizziness. 07/15/14   Piepenbrink, Victorino DikeJennifer, PA-C  metroNIDAZOLE (FLAGYL) 500 MG tablet Take 1 tablet (500 mg total) by mouth 2 (two) times daily for 7 days. 08/04/19 08/11/19  Alvira MondaySchlossman, Cornisha Zetino, MD  phenazopyridine (PYRIDIUM) 200 MG tablet Take 1  tablet (200 mg total) by mouth 3 (three) times daily. 08/04/14   Rasch, Victorino DikeJennifer I, NP  pseudoephedrine (SUDAFED) 120 MG 12 hr tablet Take 120 mg by mouth every 12 (twelve) hours as needed for congestion.    [provider]    Family History History reviewed. No pertinent family history.  Social History Social History   Tobacco Use  . Smoking status: Never Smoker  . Smokeless tobacco: Never Used  Substance Use Topics  . Alcohol use: No    Comment: occ  . Drug use: No     Allergies   Aspirin   Review of Systems Review of Systems  Constitutional: Negative for fever.  Respiratory: Negative for cough.   Gastrointestinal: Positive for abdominal pain. Negative for constipation, diarrhea, nausea and vomiting.  Genitourinary: Positive for dysuria and urgency.  Musculoskeletal: Positive for back pain.  Skin: Negative for rash.  Neurological: Negative for light-headedness.     Physical Exam Updated Vital Signs BP 128/67   Pulse 84   Temp 99 F (37.2 C) (Oral)   Resp 19   Ht 5\' 3"  (1.6 m)   Wt 86.2 kg   LMP 07/27/2019   SpO2 100%  BMI 33.66 kg/m   Physical Exam Vitals signs and nursing note reviewed.  Constitutional:      General: She is not in acute distress.    Appearance: She is well-developed. She is not diaphoretic.  HENT:     Head: Normocephalic and atraumatic.  Eyes:     Conjunctiva/sclera: Conjunctivae normal.  Neck:     Musculoskeletal: Normal range of motion.  Cardiovascular:     Rate and Rhythm: Normal rate and regular rhythm.  Pulmonary:     Effort: Pulmonary effort is normal. No respiratory distress.  Abdominal:     General: There is no distension.     Palpations: Abdomen is soft.     Tenderness: There is no abdominal tenderness. There is no guarding.  Genitourinary:    Cervix: Discharge present. No cervical motion tenderness.     Uterus: Not tender.   Musculoskeletal:        General: No tenderness.  Skin:    General: Skin is warm  and dry.     Findings: No erythema or rash.  Neurological:     Mental Status: She is alert and oriented to person, place, and time.      ED Treatments / Results  Labs (all labs ordered are listed, but only abnormal results are displayed) Labs Reviewed  WET PREP, GENITAL - Abnormal; Notable for the following components:      Result Value   Clue Cells Wet Prep HPF POC PRESENT (*)    WBC, Wet Prep HPF POC MANY (*)    All other components within normal limits  URINALYSIS, ROUTINE W REFLEX MICROSCOPIC - Abnormal; Notable for the following components:   Hgb urine dipstick SMALL (*)    Ketones, ur 15 (*)    Leukocytes,Ua TRACE (*)    All other components within normal limits  URINALYSIS, MICROSCOPIC (REFLEX) - Abnormal; Notable for the following components:   Bacteria, UA MANY (*)    All other components within normal limits  PREGNANCY, URINE  GC/CHLAMYDIA PROBE AMP ([place]) NOT AT [facility]    EKG None  Radiology No results found.  Procedures Procedures (including critical care time)  Medications Ordered in ED Medications  cefTRIAXone (ROCEPHIN) injection 250 mg (250 mg Intramuscular Given 08/04/19 2024)  azithromycin (ZITHROMAX) powder 1 g (1 g Oral Given 08/04/19 2024)  fosfomycin (MONUROL) packet 3 g (3 g Oral Given 08/04/19 2058)     Initial Impression / Assessment and Plan / ED Course  I have reviewed the triage vital signs and the nursing notes.  Pertinent labs & imaging results that were available during my care of the patient were reviewed by me and considered in my medical decision making (see chart for details).        33yo female with no significant medical history presents with concern for dysuria and frequent urination.  Urine pregnancy negative. UA with 6-10 WBC but does show bacteria. No flank pain, no fever, no n/v, symptoms for one day, doubt pyelonephritis. Offered empiric tx for STIs and pt agreed.  Given fosfomycin for possible UTI and rx for  flagyl for BV.  Patient discharged in stable condition with understanding of reasons to return.   Final Clinical Impressions(s) / ED Diagnoses   Final diagnoses:  Lower urinary tract infectious disease  Bacterial vaginosis    ED Discharge Orders         Ordered    metroNIDAZOLE (FLAGYL) 500 MG tablet  2 times daily     08/04/19 2056  Gareth Morgan, MD 08/06/19 2230

## 2021-12-28 ENCOUNTER — Encounter: Payer: 59 | Admitting: Family Medicine
